# Patient Record
Sex: Female | Born: 1960 | Race: White | Hispanic: No | Marital: Single | Smoking: Never smoker
Health system: Southern US, Community
[De-identification: ages and names within clinical notes are randomized; demographics above are authoritative.]

## PROBLEM LIST (undated history)

## (undated) DIAGNOSIS — F329 Major depressive disorder, single episode, unspecified: Secondary | ICD-10-CM

## (undated) DIAGNOSIS — E119 Type 2 diabetes mellitus without complications: Secondary | ICD-10-CM

## (undated) DIAGNOSIS — F32A Depression, unspecified: Secondary | ICD-10-CM

## (undated) DIAGNOSIS — M542 Cervicalgia: Secondary | ICD-10-CM

## (undated) DIAGNOSIS — G8929 Other chronic pain: Secondary | ICD-10-CM

## (undated) DIAGNOSIS — N390 Urinary tract infection, site not specified: Secondary | ICD-10-CM

## (undated) HISTORY — PX: ABDOMINAL HYSTERECTOMY: SHX81

---

## 2016-12-14 ENCOUNTER — Emergency Department (HOSPITAL_BASED_OUTPATIENT_CLINIC_OR_DEPARTMENT_OTHER)
Admission: EM | Admit: 2016-12-14 | Discharge: 2016-12-14 | Disposition: A | Discharge status: Home / Self Care | Payer: BLUE CROSS/BLUE SHIELD | Attending: Emergency Medicine | Admitting: Emergency Medicine

## 2016-12-14 ENCOUNTER — Emergency Department (HOSPITAL_BASED_OUTPATIENT_CLINIC_OR_DEPARTMENT_OTHER): Payer: BLUE CROSS/BLUE SHIELD

## 2016-12-14 ENCOUNTER — Encounter (HOSPITAL_BASED_OUTPATIENT_CLINIC_OR_DEPARTMENT_OTHER): Payer: Self-pay | Admitting: Emergency Medicine

## 2016-12-14 DIAGNOSIS — Z79899 Other long term (current) drug therapy: Secondary | ICD-10-CM | POA: Diagnosis not present

## 2016-12-14 DIAGNOSIS — E119 Type 2 diabetes mellitus without complications: Secondary | ICD-10-CM | POA: Diagnosis not present

## 2016-12-14 DIAGNOSIS — Z7984 Long term (current) use of oral hypoglycemic drugs: Secondary | ICD-10-CM | POA: Insufficient documentation

## 2016-12-14 DIAGNOSIS — K922 Gastrointestinal hemorrhage, unspecified: Secondary | ICD-10-CM | POA: Diagnosis not present

## 2016-12-14 DIAGNOSIS — K644 Residual hemorrhoidal skin tags: Secondary | ICD-10-CM | POA: Diagnosis not present

## 2016-12-14 DIAGNOSIS — K529 Noninfective gastroenteritis and colitis, unspecified: Secondary | ICD-10-CM | POA: Insufficient documentation

## 2016-12-14 DIAGNOSIS — R1032 Left lower quadrant pain: Secondary | ICD-10-CM | POA: Diagnosis present

## 2016-12-14 HISTORY — DX: Urinary tract infection, site not specified: N39.0

## 2016-12-14 HISTORY — DX: Major depressive disorder, single episode, unspecified: F32.9

## 2016-12-14 HISTORY — DX: Depression, unspecified: F32.A

## 2016-12-14 HISTORY — DX: Type 2 diabetes mellitus without complications: E11.9

## 2016-12-14 HISTORY — DX: Cervicalgia: M54.2

## 2016-12-14 HISTORY — DX: Other chronic pain: G89.29

## 2016-12-14 LAB — URINALYSIS, ROUTINE W REFLEX MICROSCOPIC
BILIRUBIN URINE: NEGATIVE
Glucose, UA: NEGATIVE mg/dL
Hgb urine dipstick: NEGATIVE
Ketones, ur: NEGATIVE mg/dL
Leukocytes, UA: NEGATIVE
NITRITE: NEGATIVE
PH: 6 (ref 5.0–8.0)
Protein, ur: NEGATIVE mg/dL
SPECIFIC GRAVITY, URINE: 1.008 (ref 1.005–1.030)

## 2016-12-14 LAB — COMPREHENSIVE METABOLIC PANEL
ALBUMIN: 4.6 g/dL (ref 3.5–5.0)
ALK PHOS: 63 U/L (ref 38–126)
ALT: 28 U/L (ref 14–54)
ANION GAP: 10 (ref 5–15)
AST: 30 U/L (ref 15–41)
BUN: 16 mg/dL (ref 6–20)
CALCIUM: 9.4 mg/dL (ref 8.9–10.3)
CO2: 26 mmol/L (ref 22–32)
CREATININE: 0.65 mg/dL (ref 0.44–1.00)
Chloride: 101 mmol/L (ref 101–111)
GFR calc Af Amer: >60 mL/min (ref >60)
GFR calc non Af Amer: >60 mL/min (ref >60)
GLUCOSE: 114 mg/dL — AB (ref 65–99)
Potassium: 4 mmol/L (ref 3.5–5.1)
SODIUM: 137 mmol/L (ref 135–145)
Total Bilirubin: 0.6 mg/dL (ref 0.3–1.2)
Total Protein: 7.6 g/dL (ref 6.5–8.1)

## 2016-12-14 LAB — CBC WITH DIFFERENTIAL/PLATELET
BASOS ABS: 0 10*3/uL (ref 0.0–0.1)
BASOS PCT: 0 %
EOS ABS: 0.1 10*3/uL (ref 0.0–0.7)
Eosinophils Relative: 1 %
HCT: 41.3 % (ref 36.0–46.0)
HEMOGLOBIN: 13.8 g/dL (ref 12.0–15.0)
Lymphocytes Relative: 21 %
Lymphs Abs: 2.1 10*3/uL (ref 0.7–4.0)
MCH: 29.7 pg (ref 26.0–34.0)
MCHC: 33.4 g/dL (ref 30.0–36.0)
MCV: 89 fL (ref 78.0–100.0)
Monocytes Absolute: 0.7 10*3/uL (ref 0.1–1.0)
Monocytes Relative: 7 %
NEUTROS PCT: 71 %
Neutro Abs: 7 10*3/uL (ref 1.7–7.7)
Platelets: 274 10*3/uL (ref 150–400)
RBC: 4.64 MIL/uL (ref 3.87–5.11)
RDW: 13.4 % (ref 11.5–15.5)
WBC: 10 10*3/uL (ref 4.0–10.5)

## 2016-12-14 LAB — OCCULT BLOOD X 1 CARD TO LAB, STOOL: Fecal Occult Bld: POSITIVE — AB

## 2016-12-14 MED ORDER — OXYCODONE-ACETAMINOPHEN 5-325 MG PO TABS: 1.0000 | ORAL_TABLET | Freq: Four times a day (QID) | ORAL | 0 refills | Status: AC | PRN

## 2016-12-14 MED ORDER — HYDROMORPHONE HCL 1 MG/ML IJ SOLN
0.5000 mg | Freq: Once | INTRAMUSCULAR | Status: AC
  Administered 2016-12-14: 0.5 mg via INTRAVENOUS

## 2016-12-14 MED ORDER — ONDANSETRON 4 MG PO TBDP: 4.0000 mg | ORAL_TABLET | Freq: Three times a day (TID) | ORAL | 0 refills | Status: AC | PRN

## 2016-12-14 MED ORDER — IOPAMIDOL (ISOVUE-300) INJECTION 61%
100.0000 mL | Freq: Once | INTRAVENOUS | Status: AC | PRN
  Administered 2016-12-14: 100 mL via INTRAVENOUS

## 2016-12-14 MED ORDER — CIPROFLOXACIN HCL 500 MG PO TABS: 500.0000 mg | ORAL_TABLET | Freq: Two times a day (BID) | ORAL | 0 refills | Status: AC

## 2016-12-14 MED ORDER — SODIUM CHLORIDE 0.9 % IV BOLUS (SEPSIS)
1000.0000 mL | Freq: Once | INTRAVENOUS | Status: AC
  Administered 2016-12-14: 1000 mL via INTRAVENOUS

## 2016-12-14 MED ORDER — HYDROMORPHONE HCL 1 MG/ML IJ SOLN
0.5000 mg | Freq: Once | INTRAMUSCULAR | Status: DC
  Filled 2016-12-14: qty 1

## 2016-12-14 MED ORDER — METRONIDAZOLE 500 MG PO TABS: 500.0000 mg | ORAL_TABLET | Freq: Three times a day (TID) | ORAL | 0 refills | Status: AC

## 2016-12-14 MED ORDER — CIPROFLOXACIN HCL 500 MG PO TABS
500.0000 mg | ORAL_TABLET | Freq: Once | ORAL | Status: AC
  Administered 2016-12-14: 500 mg via ORAL
  Filled 2016-12-14: qty 1

## 2016-12-14 MED ORDER — ONDANSETRON HCL 4 MG/2ML IJ SOLN
4.0000 mg | Freq: Once | INTRAMUSCULAR | Status: AC
  Administered 2016-12-14: 4 mg via INTRAVENOUS
  Filled 2016-12-14: qty 2

## 2016-12-14 MED ORDER — FLUCONAZOLE 150 MG PO TABS: 150.0000 mg | ORAL_TABLET | Freq: Every day | ORAL | 0 refills | Status: AC

## 2016-12-14 MED ORDER — HYDROMORPHONE HCL 1 MG/ML IJ SOLN
0.5000 mg | Freq: Once | INTRAMUSCULAR | Status: AC
Start: 2016-12-14 — End: 2016-12-14
  Administered 2016-12-14: 0.5 mg via INTRAVENOUS
  Filled 2016-12-14: qty 1

## 2016-12-14 MED ORDER — METRONIDAZOLE 500 MG PO TABS
500.0000 mg | ORAL_TABLET | Freq: Once | ORAL | Status: AC
  Administered 2016-12-14: 500 mg via ORAL
  Filled 2016-12-14: qty 1

## 2016-12-14 NOTE — ED Provider Notes (Signed)
comPlains of left lower quadrant pain onset approximately 24 hours ago accompanied by her rectal bleeding. She's vomited one time today. She denies fever. On exam alert appears mildly uncomfortable abdomen midline surgical scar. Nondistended. Normoactive bowel sounds. Tender at left lower quadrant without guarding rigidity or rebound   Doug SouSam Tranquilino Fischler, MD 12/14/16 2129

## 2016-12-14 NOTE — ED Notes (Signed)
Pt given ginger ale for PO fluid challenge.  

## 2016-12-14 NOTE — ED Provider Notes (Signed)
[facility]   CSN: [id number] Arrival date & time: 12/14/16  1758  By signing my name below, I, Talbert Nan, attest that this documentation has been prepared under the direction and in the presence of Josh Tanikka Bresnan PA-C. Electronically Signed: Talbert Nan, Scribe. 12/14/16. 6:57 PM.   History   Chief Complaint Chief Complaint  Patient presents with  . Rectal Bleeding    HPI Brandi Graham is a 56 y.o. female with hx of colitis, DM and UTI who presents to the Emergency Department complaining of gradual onset worsening bright red rectal bleeding that began last night. She has been passing clots as well. She has had symptoms that were more mild for the past 2 weeks but worsened acutely yesterday. Pt has associated abdominal pain, flank pain, dehydration, subjective fever, cold sweat, vomiting, and frequency of urination. Pt has had a hysterectomy, but no other abdominal surgeries. Pt is allergic to Levaquin; Morphine and related; and Penicillins.    The history is provided by the patient. No language interpreter was used.    Past Medical History:  Diagnosis Date  . Chronic neck pain   . Depression   . Diabetes mellitus without complication (HCC)   . UTI (urinary tract infection)     There are no active problems to display for this patient.   Past Surgical History:  Procedure Laterality Date  . ABDOMINAL HYSTERECTOMY      OB History    No data available       Home Medications    Prior to Admission medications   Medication Sig Start Date End Date Taking? Authorizing Provider  ALPRAZolam Prudy Feeler) 0.5 MG tablet Take 0.5 mg by mouth at bedtime as needed for anxiety.   Yes Historical Provider, MD  gabapentin (NEURONTIN) 600 MG tablet Take 600 mg by mouth 3 (three) times daily.   Yes Historical Provider, MD  metFORMIN (GLUCOPHAGE) 500 MG tablet Take by mouth 2 (two) times daily with a meal.   Yes Historical Provider, MD  sertraline (ZOLOFT) 50 MG tablet  Take 50 mg by mouth daily.   Yes Historical Provider, MD  ciprofloxacin (CIPRO) 500 MG tablet Take 1 tablet (500 mg total) by mouth 2 (two) times daily. 12/14/16   Renne Crigler, PA-C  fluconazole (DIFLUCAN) 150 MG tablet Take 1 tablet (150 mg total) by mouth daily. 12/14/16   Renne Crigler, PA-C  metroNIDAZOLE (FLAGYL) 500 MG tablet Take 1 tablet (500 mg total) by mouth 3 (three) times daily. 12/14/16   Renne Crigler, PA-C  ondansetron (ZOFRAN ODT) 4 MG disintegrating tablet Take 1 tablet (4 mg total) by mouth every 8 (eight) hours as needed for nausea or vomiting. 12/14/16   Renne Crigler, PA-C  oxyCODONE-acetaminophen (PERCOCET/ROXICET) 5-325 MG tablet Take 1-2 tablets by mouth every 6 (six) hours as needed for severe pain. 12/14/16   Renne Crigler, PA-C    Family History History reviewed. No pertinent family history.  Social History Social History  Substance Use Topics  . Smoking status: Never Smoker  . Smokeless tobacco: Never Used  . Alcohol use No     Allergies   Levaquin [levofloxacin in d5w]; Morphine and related; and Penicillins   Review of Systems Review of Systems  Constitutional: Positive for diaphoresis and fever.  HENT: Negative for rhinorrhea and sore throat.   Eyes: Negative for redness.  Respiratory: Negative for cough.   Cardiovascular: Negative for chest pain.  Gastrointestinal: Positive for abdominal pain, anal bleeding, blood in stool, hematochezia and vomiting.  Negative for diarrhea and nausea.  Endocrine: Positive for polyuria.  Genitourinary: Negative for dysuria.  Musculoskeletal: Positive for back pain. Negative for myalgias.  Skin: Negative for rash.  Neurological: Negative for headaches.     Physical Exam Updated Vital Signs BP 151/100 (BP Location: Left Arm)   Pulse 92   Temp 98.1 F (36.7 C) (Oral)   Resp 18   Ht 5\' 7"  (1.702 m)   Wt 160 lb (72.6 kg)   SpO2 100%   BMI 25.06 kg/m   Physical Exam  Constitutional: She appears well-developed and  well-nourished.  HENT:  Head: Normocephalic and atraumatic.  Eyes: Conjunctivae are normal. Right eye exhibits no discharge. Left eye exhibits no discharge.  Neck: Normal range of motion. Neck supple.  Cardiovascular: Normal rate, regular rhythm and normal heart sounds.   No murmur heard. Pulmonary/Chest: Effort normal and breath sounds normal. No respiratory distress. She has no wheezes. She has no rales.  Abdominal: Soft. She exhibits no mass. There is tenderness (moderate, LUQ and LLQ). There is guarding.  Genitourinary: Rectal exam shows external hemorrhoid (not bleeding, small) and guaiac positive stool. Rectal exam shows no internal hemorrhoid.  Genitourinary Comments: Small amount of maroon stool.   Neurological: She is alert.  Skin: Skin is warm and dry.  Psychiatric: She has a normal mood and affect.  Nursing note and vitals reviewed.    ED Treatments / Results   DIAGNOSTIC STUDIES: Oxygen Saturation is 100% on room air, normal by my interpretation.    COORDINATION OF CARE: 6:44 PM Discussed treatment plan with pt at bedside and pt agreed to plan with IV fluids, urinanalysis, rectal exam, CT scan of abdomen, and blood cultures.  Labs (all labs ordered are listed, but only abnormal results are displayed) Labs Reviewed  COMPREHENSIVE METABOLIC PANEL - Abnormal; Notable for the following:       Result Value   Glucose, Bld 114 (*)    All other components within normal limits  OCCULT BLOOD X 1 CARD TO LAB, STOOL - Abnormal; Notable for the following:    Fecal Occult Bld POSITIVE (*)    All other components within normal limits  CBC WITH DIFFERENTIAL/PLATELET  URINALYSIS, ROUTINE W REFLEX MICROSCOPIC    Procedures Procedures (including critical care time)  Medications Ordered in ED Medications  HYDROmorphone (DILAUDID) injection 0.5 mg (0.5 mg Intravenous Given 12/14/16 1905)  ondansetron (ZOFRAN) injection 4 mg (4 mg Intravenous Given 12/14/16 1903)  sodium chloride 0.9  % bolus 1,000 mL (0 mLs Intravenous Stopped 12/14/16 2125)  iopamidol (ISOVUE-300) 61 % injection 100 mL (100 mLs Intravenous Contrast Given 12/14/16 2019)  HYDROmorphone (DILAUDID) injection 0.5 mg (0.5 mg Intravenous Given 12/14/16 2050)  ciprofloxacin (CIPRO) tablet 500 mg (500 mg Oral Given 12/14/16 2124)  metroNIDAZOLE (FLAGYL) tablet 500 mg (500 mg Oral Given 12/14/16 2124)     Initial Impression / Assessment and Plan / ED Course  I have reviewed the triage vital signs and the nursing notes.  Pertinent labs & imaging results that were available during my care of the patient were reviewed by me and considered in my medical decision making (see chart for details).     Results for orders placed or performed during the hospital encounter of 12/14/16  CBC with Differential/Platelet  Result Value Ref Range   WBC 10.0 4.0 - 10.5 K/uL   RBC 4.64 3.87 - 5.11 MIL/uL   Hemoglobin 13.8 12.0 - 15.0 g/dL   HCT 16.1 09.6 - 04.5 %  MCV 89.0 78.0 - 100.0 fL   MCH 29.7 26.0 - 34.0 pg   MCHC 33.4 30.0 - 36.0 g/dL   RDW 13.4 81.111.5 - 40.991.415.5 %   Platelets 274 150 - 400 K/uL   Neutrophils Relative % 71 %   Neutro Abs 7.0 1.7 - 7.7 K/uL   Lymphocytes Relative 21 %   Lymphs Abs 2.1 0.7 - 4.0 K/uL   Monocytes Relative 7 %   Monocytes Absolute 0.7 0.1 - 1.0 K/uL   Eosinophils Relative 1 %   Eosinophils Absolute 0.1 0.0 - 0.7 K/uL   Basophils Relative 0 %   Basophils Absolute 0.0 0.0 - 0.1 K/uL  Comprehensive metabolic panel  Result Value Ref Range   Sodium 137 135 - 145 mmol/L   Potassium 4.0 3.5 - 5.1 mmol/L   Chloride 101 101 - 111 mmol/L   CO2 26 22 - 32 mmol/L   Glucose, Bld 114 (H) 65 - 99 mg/dL   BUN 16 6 - 20 mg/dL   Creatinine, Ser 7.820.65 0.44 - 1.00 mg/dL   Calcium 9.4 8.9 - 95.610.3 mg/dL   Total Protein 7.6 6.5 - 8.1 g/dL   Albumin 4.6 3.5 - 5.0 g/dL   AST 30 15 - 41 U/L   ALT 28 14 - 54 U/L   Alkaline Phosphatase 63 38 - 126 U/L   Total Bilirubin 0.6 0.3 - 1.2 mg/dL   GFR calc non Af Amer  >60 >60 mL/min   GFR calc Af Amer >60 >60 mL/min   Anion gap 10 5 - 15  Occult blood card to lab, stool  Result Value Ref Range   Fecal Occult Bld POSITIVE (A) NEGATIVE  Urinalysis, Routine w reflex microscopic  Result Value Ref Range   Color, Urine YELLOW YELLOW   APPearance CLEAR CLEAR   Specific Gravity, Urine 1.008 1.005 - 1.030   pH 6.0 5.0 - 8.0   Glucose, UA NEGATIVE NEGATIVE mg/dL   Hgb urine dipstick NEGATIVE NEGATIVE   Bilirubin Urine NEGATIVE NEGATIVE   Ketones, ur NEGATIVE NEGATIVE mg/dL   Protein, ur NEGATIVE NEGATIVE mg/dL   Nitrite NEGATIVE NEGATIVE   Leukocytes, UA NEGATIVE NEGATIVE   Ct Abdomen Pelvis W Contrast  Result Date: 12/14/2016 CLINICAL DATA:  Generalized abdominal pain and rectal bleeding since last night. EXAM: CT ABDOMEN AND PELVIS WITH CONTRAST TECHNIQUE: Multidetector CT imaging of the abdomen and pelvis was performed using the standard protocol following bolus administration of intravenous contrast. CONTRAST:  100mL ISOVUE-300 IOPAMIDOL (ISOVUE-300) INJECTION 61% COMPARISON:  None. FINDINGS: Lower chest: Mild scarring/atelectasis at the left lung base. Hepatobiliary: No focal liver abnormality is seen. No gallstones, gallbladder wall thickening, or biliary dilatation. Pancreas: Unremarkable. No pancreatic ductal dilatation or surrounding inflammatory changes. Spleen: Normal in size without focal abnormality. Adrenals/Urinary Tract: Adrenal glands appear normal. Focal scarring within the right renal cortex. Kidneys otherwise unremarkable without mass, stone or hydronephrosis. No perinephric inflammation. No ureteral or bladder calculi identified. Bladder appears normal. Stomach/Bowel: Edematous thickening of the walls of the descending colon, with surrounding pericolonic inflammation, compatible with a localized colitis. More proximal colon is normal in caliber. Stomach appears normal. Appendix is not convincingly seen but there are no inflammatory changes in the  right lower quadrant to suggest acute appendicitis. Vascular/Lymphatic: No significant vascular findings are present. No enlarged abdominal or pelvic lymph nodes. Reproductive: Status post hysterectomy.  No adnexal masses. Other: No abscess collection.  No free intraperitoneal air. Musculoskeletal: Mild degenerative change within the slightly scoliotic lumbar spine.  No acute or suspicious osseous finding. Superficial soft tissues are unremarkable. IMPRESSION: 1. Edematous thickening of the walls of the descending colon, with surrounding pericolonic inflammation, consistent with infectious or inflammatory colitis. No associated bowel obstruction. No evidence of bowel perforation. No abscess collection. 2. No other acute/significant findings. Electronically Signed   By: Bary Richard M.D.   On: 12/14/2016 20:58     Vital signs reviewed and are as follows: Vitals:   12/14/16 2037 12/14/16 2100  BP: 174/96 (!) 160/107  Pulse: 86 81  Resp: 20   Temp:     9:55 PM Patient informed of CT results. She requires second dose of IV pain medication. Patient was given oral antibiotics and fluid challenge. She did well with this. She states that her pain is currently controlled. Discussed admission versus discharge to home. She states that she would like to go home and that her nausea and pain is controlled enough currently that she feels she will do well at home. Patient was provided with prescriptions for Cipro, Flagyl, Diflucan, Percocet, and Zofran. She is given PCP referrals.   The patient was urged to return to the Emergency Department immediately with worsening of current symptoms, worsening abdominal pain, persistent vomiting, blood noted in stools, fever, or any other concerns. The patient verbalized understanding.    Final Clinical Impressions(s) / ED Diagnoses   Final diagnoses:  Colitis  Lower GI bleed   Patient with gradual onset of left-sided abdominal pain, worsening yesterday with initiation  of red stools. Patient diagnosed with descending colitis on CT scan without complicating perforation or abscess. Her vitals are stable. White blood cell count 10,000. Urine clear. Her pain is controlled and she is tolerating oral antibiotics in emergency department. Patient wants to go home. Will be discharged with the above medications. Return instructions as above. She seems reliable to return with worsening symptoms or uncontrolled symptoms. Her husband is at bedside and agrees to watch her closely.  New Prescriptions New Prescriptions   CIPROFLOXACIN (CIPRO) 500 MG TABLET    Take 1 tablet (500 mg total) by mouth 2 (two) times daily.   FLUCONAZOLE (DIFLUCAN) 150 MG TABLET    Take 1 tablet (150 mg total) by mouth daily.   METRONIDAZOLE (FLAGYL) 500 MG TABLET    Take 1 tablet (500 mg total) by mouth 3 (three) times daily.   ONDANSETRON (ZOFRAN ODT) 4 MG DISINTEGRATING TABLET    Take 1 tablet (4 mg total) by mouth every 8 (eight) hours as needed for nausea or vomiting.   OXYCODONE-ACETAMINOPHEN (PERCOCET/ROXICET) 5-325 MG TABLET    Take 1-2 tablets by mouth every 6 (six) hours as needed for severe pain.   I personally performed the services described in this documentation, which was scribed in my presence. The recorded information has been reviewed and is accurate.     Renne Crigler, PA-C 12/14/16 2159    Doug Sou, MD 12/14/16 2330

## 2016-12-14 NOTE — Discharge Instructions (Signed)
Please read and follow all provided instructions.  Your diagnoses today include:  1. Colitis   2. Lower GI bleed     Tests performed today include:  Blood counts and electrolytes  Blood tests to check liver and kidney function  Blood tests to check pancreas function  Urine test to look for infection  CT scan - shows descending colitis  Vital signs. See below for your results today.   Medications prescribed:   Ciprofloxacin - antibiotic  You have been prescribed an antibiotic medicine: take the entire course of medicine even if you are feeling better. Stopping early can cause the antibiotic not to work.   Metronidazole - antibiotic  You have been prescribed an antibiotic medicine: take the entire course of medicine even if you are feeling better. Stopping early can cause the antibiotic not to work. Do not drink alcohol when taking this medication.    Percocet (oxycodone/acetaminophen) - narcotic pain medication  DO NOT drive or perform any activities that require you to be awake and alert because this medicine can make you drowsy. BE VERY CAREFUL not to take multiple medicines containing Tylenol (also called acetaminophen). Doing so can lead to an overdose which can damage your liver and cause liver failure and possibly death.   Zofran (ondansetron) - for nausea and vomiting  Take any prescribed medications only as directed.  Home care instructions:   Follow any educational materials contained in this packet.  Follow-up instructions: Please follow-up with your primary care provider in the next 3 days for further evaluation of your symptoms.    Return instructions:  SEEK IMMEDIATE MEDICAL ATTENTION IF:  The pain does not go away or becomes severe   A temperature above 101F develops   Repeated vomiting occurs (multiple episodes)   The pain becomes localized to portions of the abdomen. The right side could possibly be appendicitis. In an adult, the left lower  portion of the abdomen could be colitis or diverticulitis.   Blood is being passed in stools or vomit (bright red or black tarry stools)   You develop chest pain, difficulty breathing, dizziness or fainting, or become confused, poorly responsive, or inconsolable (young children)  If you have any other emergent concerns regarding your health  Additional Information: Abdominal (belly) pain can be caused by many things. Your caregiver performed an examination and possibly ordered blood/urine tests and imaging (CT scan, x-rays, ultrasound). Many cases can be observed and treated at home after initial evaluation in the emergency department. Even though you are being discharged home, abdominal pain can be unpredictable. Therefore, you need a repeated exam if your pain does not resolve, returns, or worsens. Most patients with abdominal pain don't have to be admitted to the hospital or have surgery, but serious problems like appendicitis and gallbladder attacks can start out as nonspecific pain. Many abdominal conditions cannot be diagnosed in one visit, so follow-up evaluations are very important.  Your vital signs today were: BP (!) 160/107    Pulse 81    Temp 98.2 F (36.8 C) (Oral)    Resp 20    Ht 5\' 7"  (1.702 m)    Wt 72.6 kg    SpO2 95%    BMI 25.06 kg/m  If your blood pressure (bp) was elevated above 135/85 this visit, please have this repeated by your doctor within one month. --------------

## 2016-12-14 NOTE — ED Notes (Signed)
Pt given d/c instructions as per chart. Verbalizes understanding. No questions. 

## 2016-12-14 NOTE — ED Triage Notes (Signed)
Patient states that she had an "attack" with her colon last night. The patient reports that she now is having blood clots for stool

## 2017-01-09 ENCOUNTER — Emergency Department (HOSPITAL_BASED_OUTPATIENT_CLINIC_OR_DEPARTMENT_OTHER): Payer: BLUE CROSS/BLUE SHIELD

## 2017-01-09 ENCOUNTER — Emergency Department (HOSPITAL_BASED_OUTPATIENT_CLINIC_OR_DEPARTMENT_OTHER)
Admission: EM | Admit: 2017-01-09 | Discharge: 2017-01-09 | Disposition: A | Discharge status: Home / Self Care | Payer: BLUE CROSS/BLUE SHIELD | Attending: Emergency Medicine | Admitting: Emergency Medicine

## 2017-01-09 ENCOUNTER — Encounter (HOSPITAL_BASED_OUTPATIENT_CLINIC_OR_DEPARTMENT_OTHER): Payer: Self-pay | Admitting: *Deleted

## 2017-01-09 DIAGNOSIS — Z7984 Long term (current) use of oral hypoglycemic drugs: Secondary | ICD-10-CM | POA: Insufficient documentation

## 2017-01-09 DIAGNOSIS — M25561 Pain in right knee: Secondary | ICD-10-CM | POA: Insufficient documentation

## 2017-01-09 DIAGNOSIS — E119 Type 2 diabetes mellitus without complications: Secondary | ICD-10-CM | POA: Diagnosis not present

## 2017-01-09 MED ORDER — HYDROCODONE-ACETAMINOPHEN 5-325 MG PO TABS: 1.0000 | ORAL_TABLET | Freq: Four times a day (QID) | ORAL | 0 refills | Status: AC | PRN

## 2017-01-09 MED ORDER — PREDNISONE 10 MG PO TABS: 20.0000 mg | ORAL_TABLET | Freq: Two times a day (BID) | ORAL | 0 refills | Status: AC

## 2017-01-09 MED FILL — HYDROCODON-APAP 5-325: 5-325 | 2 days supply | Qty: 15 | Fill #0

## 2017-01-09 MED FILL — predniSONE 10 MG TABS: 10 | 5 days supply | Qty: 20 | Fill #0

## 2017-01-09 NOTE — Discharge Instructions (Signed)
Prednisone as prescribed.  Hydrocodone as prescribed as needed for pain.  Follow-up with orthopedics if not improving in the next week. The contact information for [facility] has been provided in this discharge summary for you to call and make these arrangements.

## 2017-01-09 NOTE — ED Provider Notes (Signed)
[facility]   CSN: [id number] Arrival date & time: 01/09/17  [id number]     History   Chief Complaint Chief Complaint  Patient presents with  . Leg Pain    HPI Brandi Graham is a 56 y.o. female.  Patient is a [age] sure old female with history of diabetes, depression, and chronic neck pain. She presents for evaluation of right knee pain. She's had issues with her knee for several years, however it has significantly worsened over the past week. This began in the absence of any new injury or trauma. She denies any redness, warmth, or fever. Her pain is worse with ambulating and there are no alleviating factors.   The history is provided by the patient.  Leg Pain   This is a new problem. Episode onset: 1 week ago. The problem occurs constantly. The problem has been rapidly worsening. The pain is present in the right knee. The quality of the pain is described as aching. The pain is moderate. Pertinent negatives include no numbness. She has tried OTC pain medications for the symptoms. The treatment provided mild relief.    Past Medical History:  Diagnosis Date  . Chronic neck pain   . Depression   . Diabetes mellitus without complication (HCC)   . UTI (urinary tract infection)     There are no active problems to display for this patient.   Past Surgical History:  Procedure Laterality Date  . ABDOMINAL HYSTERECTOMY      OB History    No data available       Home Medications    Prior to Admission medications   Medication Sig Start Date End Date Taking? Authorizing Provider  ALPRAZolam Prudy Feeler(XANAX) 0.5 MG tablet Take 0.5 mg by mouth at bedtime as needed for anxiety.    Historical Provider, MD  ciprofloxacin (CIPRO) 500 MG tablet Take 1 tablet (500 mg total) by mouth 2 (two) times daily. 12/14/16   Renne CriglerJoshua Geiple, PA-C  fluconazole (DIFLUCAN) 150 MG tablet Take 1 tablet (150 mg total) by mouth daily. 12/14/16   Renne CriglerJoshua Geiple, PA-C  gabapentin (NEURONTIN) 600 MG  tablet Take 600 mg by mouth 3 (three) times daily.    Historical Provider, MD  metFORMIN (GLUCOPHAGE) 500 MG tablet Take by mouth 2 (two) times daily with a meal.    Historical Provider, MD  metroNIDAZOLE (FLAGYL) 500 MG tablet Take 1 tablet (500 mg total) by mouth 3 (three) times daily. 12/14/16   Renne CriglerJoshua Geiple, PA-C  ondansetron (ZOFRAN ODT) 4 MG disintegrating tablet Take 1 tablet (4 mg total) by mouth every 8 (eight) hours as needed for nausea or vomiting. 12/14/16   Renne CriglerJoshua Geiple, PA-C  oxyCODONE-acetaminophen (PERCOCET/ROXICET) 5-325 MG tablet Take 1-2 tablets by mouth every 6 (six) hours as needed for severe pain. 12/14/16   Renne CriglerJoshua Geiple, PA-C  sertraline (ZOLOFT) 50 MG tablet Take 50 mg by mouth daily.    Historical Provider, MD    Family History History reviewed. No pertinent family history.  Social History Social History  Substance Use Topics  . Smoking status: Never Smoker  . Smokeless tobacco: Never Used  . Alcohol use No     Allergies   Levaquin [levofloxacin in d5w]; Morphine and related; and Penicillins   Review of Systems Review of Systems  Neurological: Negative for numbness.  All other systems reviewed and are negative.    Physical Exam Updated Vital Signs BP 163/97 (BP Location: Left Arm)   Pulse 109   Temp 98.5  F (36.9 C) (Oral)   Resp 18   Ht 5\' 7"  (1.702 m)   Wt 155 lb (70.3 kg)   SpO2 97%   BMI 24.28 kg/m   Physical Exam  Constitutional: She is oriented to person, place, and time. She appears well-developed and well-nourished. No distress.  HENT:  Head: Normocephalic and atraumatic.  Neck: Normal range of motion. Neck supple.  Musculoskeletal:  The right knee appears grossly normal. There is no erythema, warmth, or effusion. She has good range of motion and I am unable to appreciate any crepitus. Anterior and posterior drawer tests are negative. There is no laxity with varus or valgus stress.  Neurological: She is alert and oriented to person,  place, and time.  Skin: Skin is warm and dry. She is not diaphoretic.  Nursing note and vitals reviewed.    ED Treatments / Results  Labs (all labs ordered are listed, but only abnormal results are displayed) Labs Reviewed - No data to display  EKG  EKG Interpretation None       Radiology No results found.  Procedures Procedures (including critical care time)  Medications Ordered in ED Medications - No data to display   Initial Impression / Assessment and Plan / ED Course  I have reviewed the triage vital signs and the nursing notes.  Pertinent labs & imaging results that were available during my care of the patient were reviewed by me and considered in my medical decision making (see chart for details).  Patient presents with complaints of knee pain. Her physical examination is unremarkable and x-rays don't show significant degenerative joint disease. She will be treated with prednisone, pain medication, and follow-up with orthopedics as needed if not improving.  Final Clinical Impressions(s) / ED Diagnoses   Final diagnoses:  None    New Prescriptions New Prescriptions   No medications on file     Geoffery Lyons, MD 01/09/17 1100

## 2017-01-09 NOTE — ED Triage Notes (Signed)
Pt reports right knee pain for years, worse over the last week. Denies any recent injury or trauma.

## 2017-08-05 IMAGING — DX DG KNEE COMPLETE 4+V*R*
4 series · 4 of 4 positions shown · non-contrast
Comparison: None.

CLINICAL DATA: Right knee pain for 1 week.  No reported injury.

EXAM:
RIGHT KNEE - COMPLETE 4+ VIEW

[knee ap]
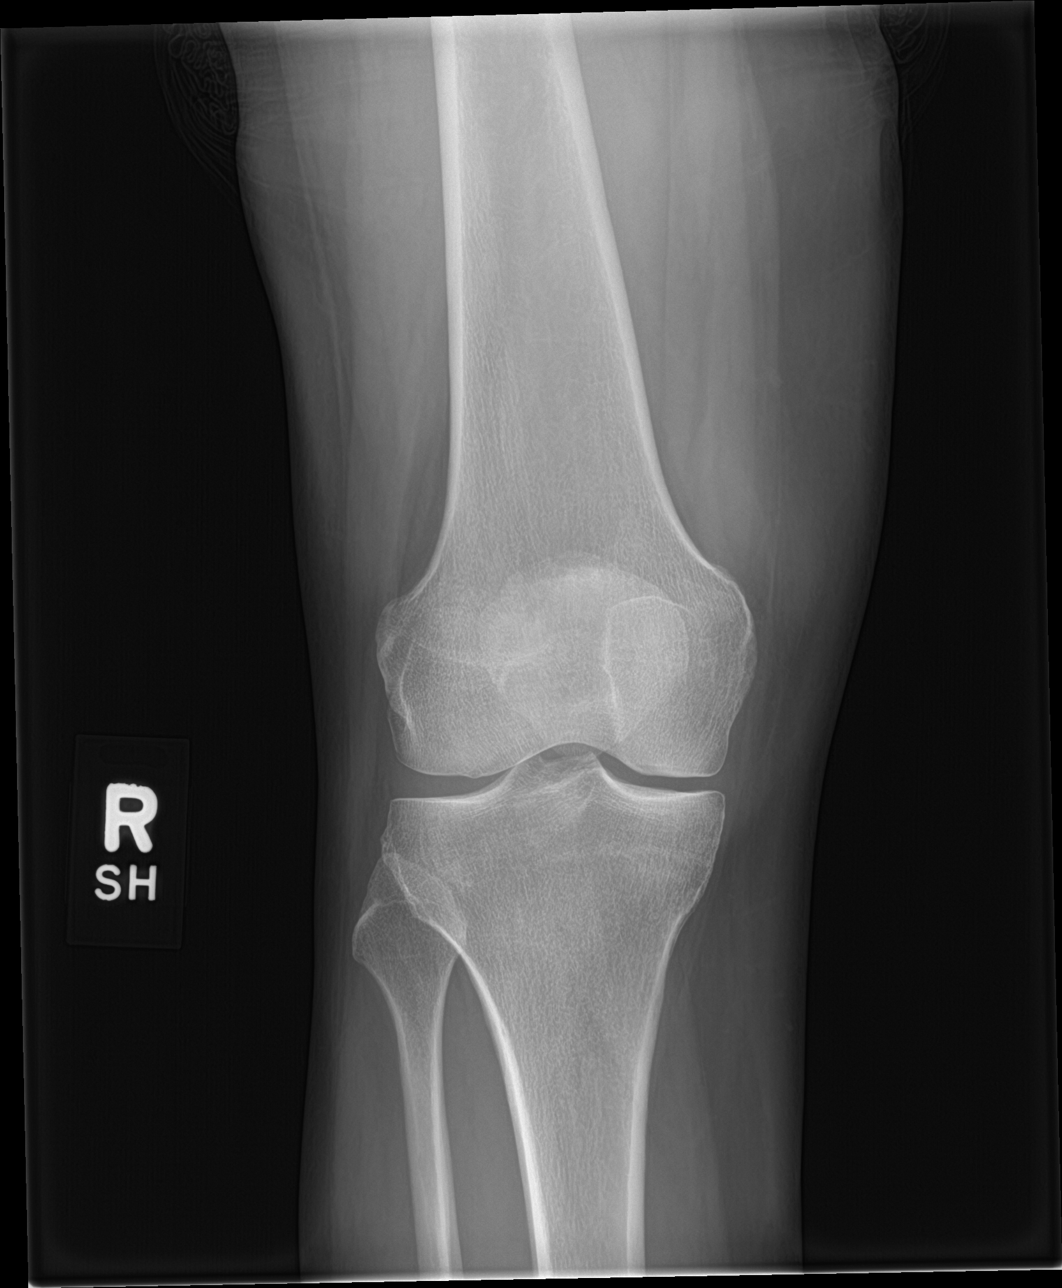

[knee lat]
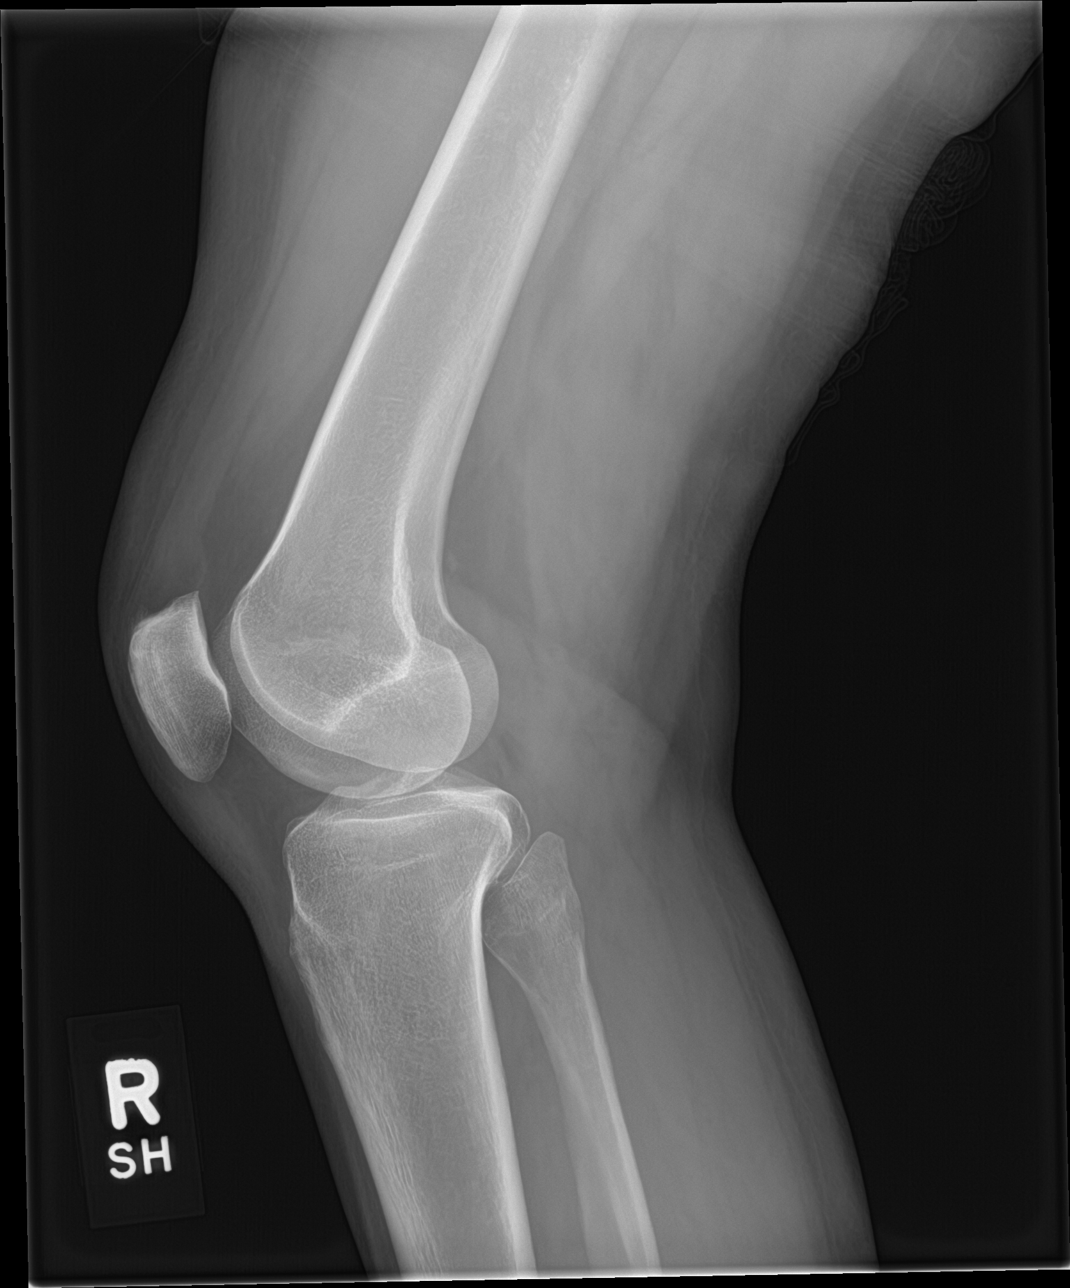

[knee obl (1 of 2)]
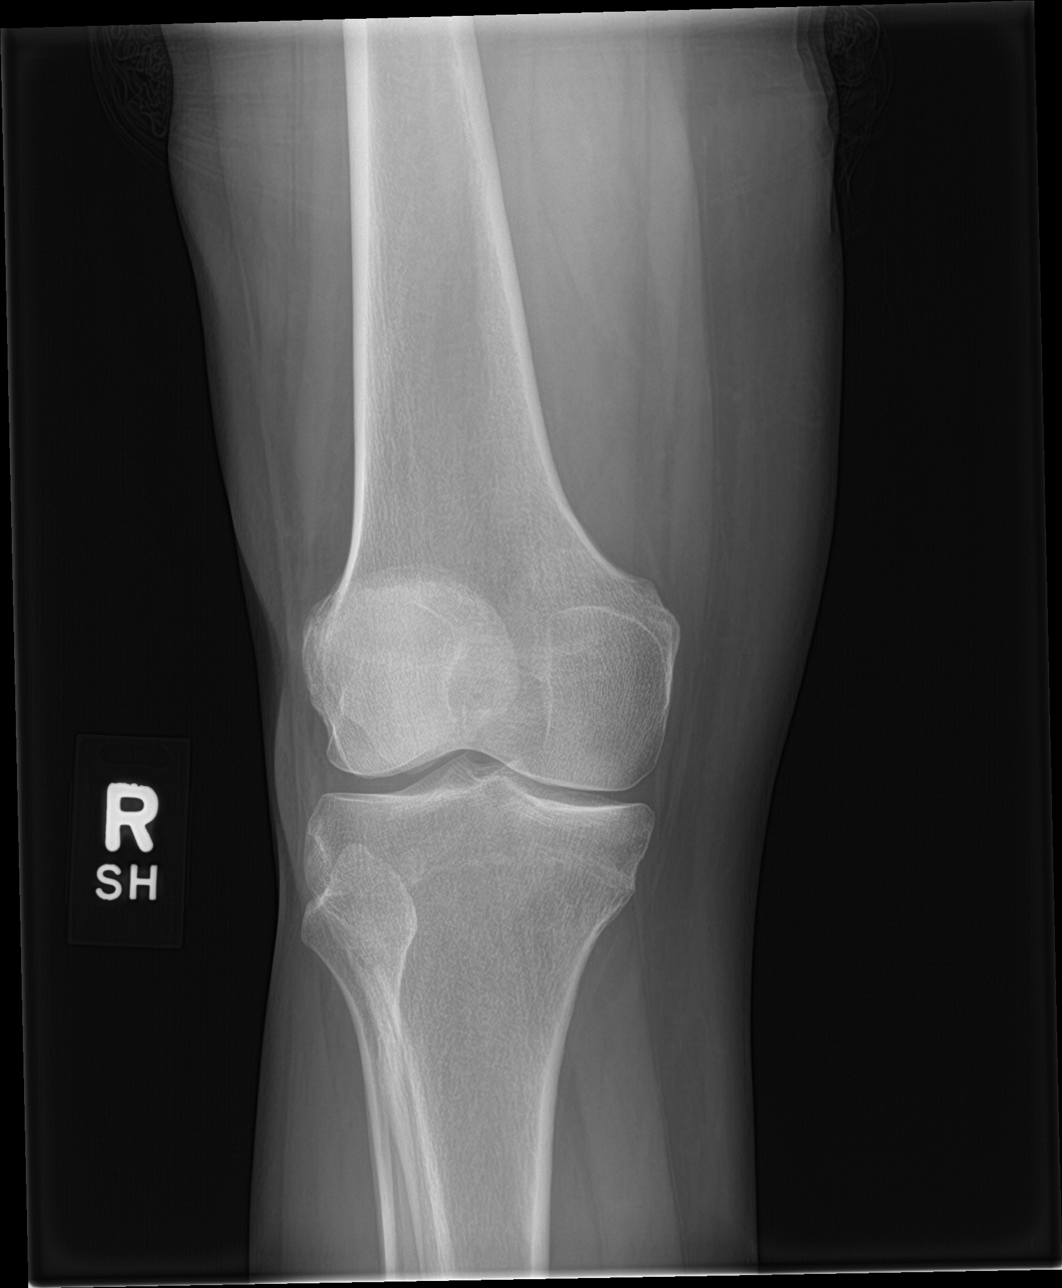

[knee obl (2 of 2)]
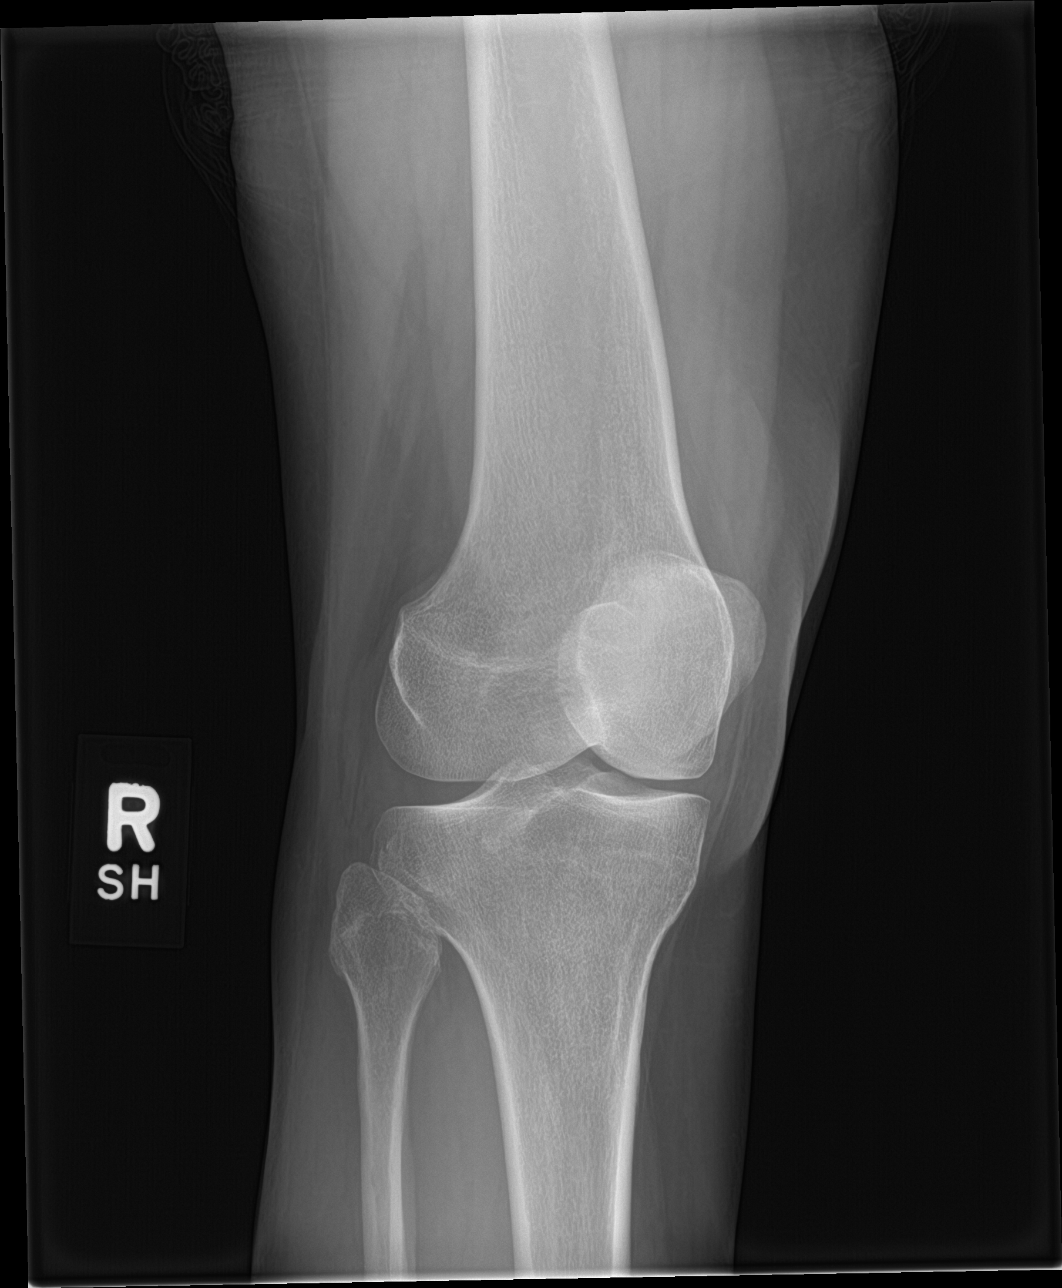

[4 of 4 positions shown; findings below may reference images not displayed]

FINDINGS: No fracture, joint effusion or malalignment. No suspicious focal
osseous lesion. No appreciable degenerative or erosive arthropathy.
Tiny superior right patellar enthesophyte. No radiopaque foreign
body.
IMPRESSION: 1. No fracture, joint effusion or malalignment in the right knee.
2. No significant right knee arthropathy.
3. Tiny superior right patellar enthesophyte.
